# Patient Record
Sex: Male | Born: 1995 | Race: White | Hispanic: No | Marital: Single | Smoking: Current every day smoker
Health system: Southern US, Community
[De-identification: ages and names within clinical notes are randomized; demographics above are authoritative.]

---
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
## 2015-03-19 ENCOUNTER — Encounter (HOSPITAL_COMMUNITY): Payer: Self-pay | Admitting: Emergency Medicine

## 2015-03-19 ENCOUNTER — Emergency Department (HOSPITAL_COMMUNITY)
Admission: EM | Admit: 2015-03-19 | Discharge: 2015-03-19 | Disposition: A | Discharge status: Home / Self Care | Payer: Medicaid Other | Attending: Emergency Medicine | Admitting: Emergency Medicine

## 2015-03-19 DIAGNOSIS — R0981 Nasal congestion: Secondary | ICD-10-CM | POA: Diagnosis not present

## 2015-03-19 DIAGNOSIS — J018 Other acute sinusitis: Secondary | ICD-10-CM | POA: Diagnosis not present

## 2015-03-19 DIAGNOSIS — Z792 Long term (current) use of antibiotics: Secondary | ICD-10-CM | POA: Diagnosis not present

## 2015-03-19 DIAGNOSIS — R0989 Other specified symptoms and signs involving the circulatory and respiratory systems: Secondary | ICD-10-CM | POA: Diagnosis not present

## 2015-03-19 DIAGNOSIS — Z720 Tobacco use: Secondary | ICD-10-CM | POA: Insufficient documentation

## 2015-03-19 DIAGNOSIS — J029 Acute pharyngitis, unspecified: Secondary | ICD-10-CM

## 2015-03-19 DIAGNOSIS — M791 Myalgia: Secondary | ICD-10-CM | POA: Diagnosis not present

## 2015-03-19 LAB — RAPID STREP SCREEN (MED CTR MEBANE ONLY): Streptococcus, Group A Screen (Direct): NEGATIVE

## 2015-03-19 MED ORDER — IBUPROFEN 600 MG PO TABS: 600.0000 mg | ORAL_TABLET | Freq: Four times a day (QID) | ORAL | Status: DC | PRN

## 2015-03-19 MED ORDER — AMOXICILLIN 500 MG PO CAPS: 500.0000 mg | ORAL_CAPSULE | Freq: Three times a day (TID) | ORAL | Status: DC

## 2015-03-19 MED ORDER — PREDNISONE 10 MG PO TABS: ORAL_TABLET | ORAL | Status: DC

## 2015-03-19 MED ORDER — LORATADINE-PSEUDOEPHEDRINE ER 5-120 MG PO TB12: 1.0000 | ORAL_TABLET | Freq: Two times a day (BID) | ORAL | Status: DC

## 2015-03-19 NOTE — ED Notes (Signed)
Pt c/o of sore throat, cough, and generalized body aches for past 3 days. Pt denies fever/chills.

## 2015-03-19 NOTE — ED Provider Notes (Signed)
CSN: [id number]     Arrival date & time 03/19/15  1217 History   First MD Initiated Contact with Patient 03/19/15 1253     Chief Complaint  Patient presents with  . Sore Throat     (Consider location/radiation/quality/duration/timing/severity/associated sxs/prior Treatment) Patient is a 19 y.o. male presenting with pharyngitis. The history is provided by the patient.  Sore Throat This is a new problem. The current episode started in the past 7 days. The problem occurs intermittently. The problem has been gradually worsening. Associated symptoms include congestion, coughing, myalgias and a sore throat. The symptoms are aggravated by swallowing. He has tried nothing for the symptoms. The treatment provided no relief.    History reviewed. No pertinent past medical history. History reviewed. No pertinent past surgical history. No family history on file. Social History  Substance Use Topics  . Smoking status: Current Every Day Smoker -- 0.50 packs/day    Types: Cigarettes  . Smokeless tobacco: Never Used  . Alcohol Use: Yes     Comment: occ.    Review of Systems  HENT: Positive for congestion and sore throat.   Respiratory: Positive for cough.   Musculoskeletal: Positive for myalgias.  All other systems reviewed and are negative.     Allergies  Review of patient's allergies indicates no known allergies.  Home Medications   Prior to Admission medications   Medication Sig Start Date End Date Taking? Authorizing Provider  oxyCODONE-acetaminophen (PERCOCET) 7.5-325 MG per tablet Take 1 tablet by mouth every 6 (six) hours as needed for severe pain. 12/19/14   Joni Reining, PA-C  sulfamethoxazole-trimethoprim (BACTRIM DS,SEPTRA DS) 800-160 MG per tablet Take 1 tablet by mouth 2 (two) times daily. 12/19/14   Joni Reining, PA-C   BP 117/61 mmHg  Pulse 54  Temp(Src) 98.2 F (36.8 C) (Oral)  Resp 18  Ht 5\' 10"  (1.778 m)  Wt 135 lb (61.236 kg)  BMI 19.37 kg/m2  SpO2  100% Physical Exam  Constitutional: He is oriented to person, place, and time. He appears well-developed and well-nourished.  Non-toxic appearance.  HENT:  Head: Normocephalic.  Right Ear: Tympanic membrane and external ear normal.  Left Ear: Tympanic membrane and external ear normal.  Nasal congestion present.  There is moderate increased redness of the posterior pharynx. There is mild swelling of the uvula. The uvula is in the midline. The airway is patent.  Eyes: EOM and lids are normal. Pupils are equal, round, and reactive to light.  Neck: Normal range of motion. Neck supple. Carotid bruit is not present.  Cardiovascular: Normal rate, regular rhythm, normal heart sounds, intact distal pulses and normal pulses.   Pulmonary/Chest: Breath sounds normal. No respiratory distress.  Few scattered rhonchi present. Symmetrical rise and fall of the chest. Patient speaks in complete sentences.  Abdominal: Soft. Bowel sounds are normal. There is no tenderness. There is no guarding.  Musculoskeletal: Normal range of motion.  Lymphadenopathy:       Head (right side): No submandibular adenopathy present.       Head (left side): No submandibular adenopathy present.    He has no cervical adenopathy.  Neurological: He is alert and oriented to person, place, and time. He has normal strength. No cranial nerve deficit or sensory deficit. He exhibits normal muscle tone. Coordination normal.  Skin: Skin is warm and dry. No rash noted.  Psychiatric: He has a normal mood and affect. His speech is normal.  Nursing note and vitals reviewed.   ED Course  Procedures (including critical care time) Labs Review Labs Reviewed - No data to display  Imaging Review No results found. I have personally reviewed and evaluated these images and lab results as part of my medical decision-making.   EKG Interpretation None      MDM  Vital signs reviewed. The examination favors pharyngitis and sinusitis. Patient  is given a mask to use. He is asked to wash hands frequently. He is asked to increase fluids. Prescription for prednisone, ibuprofen, Claritin-D, and Amoxil given to the patient.    Final diagnoses:  None    *I have reviewed nursing notes, vital signs, and all appropriate lab and imaging results for this patient.Ivery Quale, PA-C 03/19/15 1348  Marily Memos, MD 03/20/15 [phone]

## 2015-03-19 NOTE — Discharge Instructions (Signed)
Please wash hands frequently. Please use a mask until symptoms have resolved. Please increase fluids. Use ibuprofen, prednisone, Amoxil daily. Use Claritin-D every 12 hours if needed for congestion and cough. Please see her Medicaid access physician for additional evaluation if not improving. Pharyngitis Pharyngitis is redness, pain, and swelling (inflammation) of your pharynx.  CAUSES  Pharyngitis is usually caused by infection. Most of the time, these infections are from viruses (viral) and are part of a cold. However, sometimes pharyngitis is caused by bacteria (bacterial). Pharyngitis can also be caused by allergies. Viral pharyngitis may be spread from person to person by coughing, sneezing, and personal items or utensils (cups, forks, spoons, toothbrushes). Bacterial pharyngitis may be spread from person to person by more intimate contact, such as kissing.  SIGNS AND SYMPTOMS  Symptoms of pharyngitis include:   Sore throat.   Tiredness (fatigue).   Low-grade fever.   Headache.  Joint pain and muscle aches.  Skin rashes.  Swollen lymph nodes.  Plaque-like film on throat or tonsils (often seen with bacterial pharyngitis). DIAGNOSIS  Your health care provider will ask you questions about your illness and your symptoms. Your medical history, along with a physical exam, is often all that is needed to diagnose pharyngitis. Sometimes, a rapid strep test is done. Other lab tests may also be done, depending on the suspected cause.  TREATMENT  Viral pharyngitis will usually get better in 3-4 days without the use of medicine. Bacterial pharyngitis is treated with medicines that kill germs (antibiotics).  HOME CARE INSTRUCTIONS   Drink enough water and fluids to keep your urine clear or pale yellow.   Only take over-the-counter or prescription medicines as directed by your health care provider:   If you are prescribed antibiotics, make sure you finish them even if you start to feel  better.   Do not take aspirin.   Get lots of rest.   Gargle with 8 oz of salt water ( tsp of salt per 1 qt of water) as often as every 1-2 hours to soothe your throat.   Throat lozenges (if you are not at risk for choking) or sprays may be used to soothe your throat. SEEK MEDICAL CARE IF:   You have large, tender lumps in your neck.  You have a rash.  You cough up green, yellow-brown, or bloody spit. SEEK IMMEDIATE MEDICAL CARE IF:   Your neck becomes stiff.  You drool or are unable to swallow liquids.  You vomit or are unable to keep medicines or liquids down.  You have severe pain that does not go away with the use of recommended medicines.  You have trouble breathing (not caused by a stuffy nose). MAKE SURE YOU:   Understand these instructions.  Will watch your condition.  Will get help right away if you are not doing well or get worse. Document Released: 07/03/2005 Document Revised: 04/23/2013 Document Reviewed: 03/10/2013 [facility] Patient Information 2015 [place], [place]. This information is not intended to replace advice given to you by your health care provider. Make sure you discuss any questions you have with your health care provider.

## 2015-03-22 LAB — CULTURE, GROUP A STREP: Strep A Culture: NEGATIVE

## 2015-04-29 ENCOUNTER — Emergency Department
Admission: EM | Admit: 2015-04-29 | Discharge: 2015-04-29 | Disposition: A | Discharge status: Home / Self Care | Payer: Medicaid Other | Attending: Emergency Medicine | Admitting: Emergency Medicine

## 2015-04-29 ENCOUNTER — Encounter: Payer: Self-pay | Admitting: Emergency Medicine

## 2015-04-29 ENCOUNTER — Emergency Department: Payer: Medicaid Other

## 2015-04-29 DIAGNOSIS — Z79899 Other long term (current) drug therapy: Secondary | ICD-10-CM | POA: Insufficient documentation

## 2015-04-29 DIAGNOSIS — Y998 Other external cause status: Secondary | ICD-10-CM | POA: Insufficient documentation

## 2015-04-29 DIAGNOSIS — S8991XA Unspecified injury of right lower leg, initial encounter: Secondary | ICD-10-CM | POA: Diagnosis not present

## 2015-04-29 DIAGNOSIS — Z792 Long term (current) use of antibiotics: Secondary | ICD-10-CM | POA: Diagnosis not present

## 2015-04-29 DIAGNOSIS — Y92410 Unspecified street and highway as the place of occurrence of the external cause: Secondary | ICD-10-CM | POA: Diagnosis not present

## 2015-04-29 DIAGNOSIS — Y9389 Activity, other specified: Secondary | ICD-10-CM | POA: Diagnosis not present

## 2015-04-29 DIAGNOSIS — Z720 Tobacco use: Secondary | ICD-10-CM | POA: Diagnosis not present

## 2015-04-29 DIAGNOSIS — S0990XA Unspecified injury of head, initial encounter: Secondary | ICD-10-CM | POA: Insufficient documentation

## 2015-04-29 MED ORDER — TRAMADOL HCL 50 MG PO TABS: 50.0000 mg | ORAL_TABLET | Freq: Four times a day (QID) | ORAL | Status: AC | PRN
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
## 2015-04-29 NOTE — Discharge Instructions (Signed)

## 2015-04-30 ENCOUNTER — Emergency Department (HOSPITAL_COMMUNITY): Payer: Medicaid Other

## 2015-04-30 ENCOUNTER — Emergency Department (HOSPITAL_COMMUNITY)
Admission: EM | Admit: 2015-04-30 | Discharge: 2015-04-30 | Disposition: A | Discharge status: Home / Self Care | Payer: Medicaid Other | Attending: Emergency Medicine | Admitting: Emergency Medicine

## 2015-04-30 ENCOUNTER — Encounter (HOSPITAL_COMMUNITY): Payer: Self-pay | Admitting: *Deleted

## 2015-04-30 DIAGNOSIS — Z79899 Other long term (current) drug therapy: Secondary | ICD-10-CM | POA: Insufficient documentation

## 2015-04-30 DIAGNOSIS — S161XXD Strain of muscle, fascia and tendon at neck level, subsequent encounter: Secondary | ICD-10-CM | POA: Insufficient documentation

## 2015-04-30 DIAGNOSIS — Z720 Tobacco use: Secondary | ICD-10-CM | POA: Insufficient documentation

## 2015-04-30 DIAGNOSIS — S199XXD Unspecified injury of neck, subsequent encounter: Secondary | ICD-10-CM | POA: Diagnosis present

## 2015-04-30 MED ORDER — HYDROCODONE-ACETAMINOPHEN 5-325 MG PO TABS: ORAL_TABLET | ORAL | Status: DC

## 2015-04-30 MED ORDER — CYCLOBENZAPRINE HCL 10 MG PO TABS: 10.0000 mg | ORAL_TABLET | Freq: Three times a day (TID) | ORAL | Status: DC | PRN

## 2015-04-30 MED ORDER — CYCLOBENZAPRINE HCL 10 MG PO TABS
10.0000 mg | ORAL_TABLET | Freq: Once | ORAL | Status: AC
  Administered 2015-04-30: 10 mg via ORAL
  Filled 2015-04-30: qty 1

## 2015-04-30 MED ORDER — HYDROCODONE-ACETAMINOPHEN 5-325 MG PO TABS
1.0000 | ORAL_TABLET | Freq: Once | ORAL | Status: AC
  Administered 2015-04-30: 1 via ORAL
  Filled 2015-04-30: qty 1

## 2015-04-30 NOTE — ED Notes (Signed)
Discharge instructions given, pt demonstrated teach back and verbal understanding. No concerns voiced.  
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
## 2016-08-09 ENCOUNTER — Encounter: Payer: Self-pay | Admitting: Emergency Medicine

## 2016-08-09 ENCOUNTER — Emergency Department
Admission: EM | Admit: 2016-08-09 | Discharge: 2016-08-09 | Disposition: A | Discharge status: Home / Self Care | Payer: No Typology Code available for payment source | Attending: Emergency Medicine | Admitting: Emergency Medicine

## 2016-08-09 ENCOUNTER — Emergency Department: Payer: No Typology Code available for payment source

## 2016-08-09 DIAGNOSIS — S3992XA Unspecified injury of lower back, initial encounter: Secondary | ICD-10-CM | POA: Diagnosis present

## 2016-08-09 DIAGNOSIS — S39012A Strain of muscle, fascia and tendon of lower back, initial encounter: Secondary | ICD-10-CM | POA: Diagnosis not present

## 2016-08-09 DIAGNOSIS — Y9389 Activity, other specified: Secondary | ICD-10-CM | POA: Diagnosis not present

## 2016-08-09 DIAGNOSIS — Y999 Unspecified external cause status: Secondary | ICD-10-CM | POA: Diagnosis not present

## 2016-08-09 DIAGNOSIS — Y92410 Unspecified street and highway as the place of occurrence of the external cause: Secondary | ICD-10-CM | POA: Diagnosis not present

## 2016-08-09 DIAGNOSIS — F17210 Nicotine dependence, cigarettes, uncomplicated: Secondary | ICD-10-CM | POA: Insufficient documentation

## 2016-08-09 DIAGNOSIS — M545 Low back pain, unspecified: Secondary | ICD-10-CM

## 2016-08-09 MED ORDER — NAPROXEN 500 MG PO TABS: 500.0000 mg | ORAL_TABLET | Freq: Two times a day (BID) | ORAL | 0 refills | Status: DC

## 2016-08-09 MED ORDER — TIZANIDINE HCL 4 MG PO TABS: 4.0000 mg | ORAL_TABLET | Freq: Three times a day (TID) | ORAL | 0 refills | Status: DC
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
## 2016-08-09 NOTE — ED Notes (Signed)
Pt able to ambulate without difficulty or distress. Pt verbalized understanding of discharge instructions, follow up and prescription.

## 2016-12-02 ENCOUNTER — Emergency Department: Payer: Medicaid Other

## 2016-12-02 ENCOUNTER — Emergency Department
Admission: EM | Admit: 2016-12-02 | Discharge: 2016-12-02 | Disposition: A | Discharge status: Home / Self Care | Payer: Medicaid Other | Attending: Emergency Medicine | Admitting: Emergency Medicine

## 2016-12-02 ENCOUNTER — Encounter: Payer: Self-pay | Admitting: Emergency Medicine

## 2016-12-02 DIAGNOSIS — R079 Chest pain, unspecified: Secondary | ICD-10-CM | POA: Insufficient documentation

## 2016-12-02 DIAGNOSIS — M25512 Pain in left shoulder: Secondary | ICD-10-CM | POA: Insufficient documentation

## 2016-12-02 DIAGNOSIS — F17210 Nicotine dependence, cigarettes, uncomplicated: Secondary | ICD-10-CM | POA: Insufficient documentation

## 2016-12-02 DIAGNOSIS — X509XXA Other and unspecified overexertion or strenuous movements or postures, initial encounter: Secondary | ICD-10-CM | POA: Insufficient documentation

## 2016-12-02 DIAGNOSIS — Y999 Unspecified external cause status: Secondary | ICD-10-CM | POA: Insufficient documentation

## 2016-12-02 DIAGNOSIS — Y9367 Activity, basketball: Secondary | ICD-10-CM | POA: Insufficient documentation

## 2016-12-02 DIAGNOSIS — Y929 Unspecified place or not applicable: Secondary | ICD-10-CM | POA: Insufficient documentation

## 2016-12-02 LAB — BASIC METABOLIC PANEL
Anion gap: 7 (ref 5–15)
BUN: 15 mg/dL (ref 6–20)
CALCIUM: 8.8 mg/dL — AB (ref 8.9–10.3)
CO2: 28 mmol/L (ref 22–32)
Chloride: 102 mmol/L (ref 101–111)
Creatinine, Ser: 0.98 mg/dL (ref 0.61–1.24)
GFR calc non Af Amer: >60 mL/min (ref >60)
Glucose, Bld: 96 mg/dL (ref 65–99)
Potassium: 3.6 mmol/L (ref 3.5–5.1)
SODIUM: 137 mmol/L (ref 135–145)

## 2016-12-02 LAB — CBC
HCT: 42.9 % (ref 40.0–52.0)
Hemoglobin: 14.9 g/dL (ref 13.0–18.0)
MCH: 29.2 pg (ref 26.0–34.0)
MCHC: 34.7 g/dL (ref 32.0–36.0)
MCV: 84.3 fL (ref 80.0–100.0)
Platelets: 234 10*3/uL (ref 150–440)
RBC: 5.09 MIL/uL (ref 4.40–5.90)
RDW: 14.3 % (ref 11.5–14.5)
WBC: 6.3 10*3/uL (ref 3.8–10.6)

## 2016-12-02 LAB — TROPONIN I: Troponin I: <0.03 ng/mL (ref <0.03)

## 2016-12-02 MED ORDER — IBUPROFEN 600 MG PO TABS: 600.0000 mg | ORAL_TABLET | Freq: Four times a day (QID) | ORAL | 0 refills | Status: DC | PRN

## 2016-12-02 MED ORDER — KETOROLAC TROMETHAMINE 30 MG/ML IJ SOLN
30.0000 mg | Freq: Once | INTRAMUSCULAR | Status: AC
  Administered 2016-12-02: 30 mg via INTRAMUSCULAR
  Filled 2016-12-02: qty 1

## 2016-12-02 MED ORDER — ACETAMINOPHEN 500 MG PO TABS
1000.0000 mg | ORAL_TABLET | Freq: Once | ORAL | Status: AC
  Administered 2016-12-02: 1000 mg via ORAL
  Filled 2016-12-02: qty 2

## 2016-12-02 NOTE — ED Notes (Signed)
Patient to ER with c/o pain to left chest/ribs/axillary region. Pain can be reproduced/worsens with movement. Patient reports pain with palpation.

## 2016-12-02 NOTE — ED Triage Notes (Signed)
Pt c/o chest pain that started this morning when he woke up and has been constant. Pt reports feels like he is going to die. denies drug use.  Pt grasping chest and sitting in floor while checking in.  Reports had same pain yesterday and passed out because it was so bad.  Pain worse on palpation. VSS

## 2016-12-02 NOTE — ED Provider Notes (Signed)
[facility] Emergency Department Provider Note  ____________________________________________  Time seen: Approximately 11:37 AM  I have reviewed the triage vital signs and the nursing notes.   HISTORY  Chief Complaint Chest Pain   HPI Timothy Butler is a 21 y.o. male with h/o smoking who presents for evaluation of Left shoulder pain. Patient reports 2 days of dull constant L shoulder pain radiating to the left scapula and axilla, currently 10 out of 10, worse with movement of the L arm. Has not tried anything at home for the pain. Pain started 1 day after patient was playing basketball. He also works with the [employer] where patient performs a lot of physical work with his arms. He is left handed. He denies trauma to his shoulder. He denies CP, SOB, nausea, vomiting, numbness or weakness of his arm, fever or chills. No personal or family history of blood clots or ischemic heart disease. Patient tells me yesterday that he was walking around with his friends when the pain got very severe. He reports that he felt nauseous and passed out due to the intensity of the pain. He did not sustain any injuries from the fall. He denies any drug use.  History reviewed. No pertinent past medical history.  There are no active problems to display for this patient.   History reviewed. No pertinent surgical history.  Prior to Admission medications   Medication Sig Start Date End Date Taking? Authorizing Provider  ibuprofen (ADVIL,MOTRIN) 600 MG tablet Take 1 tablet (600 mg total) by mouth every 6 (six) hours as needed. 12/02/16   Nita SickleVeronese, [place], MD  naproxen (NAPROSYN) 500 MG tablet Take 1 tablet (500 mg total) by mouth 2 (two) times daily with a meal. Patient not taking: Reported on 12/02/2016 08/09/16   Kem Boroughsriplett, Cari B, FNP  tiZANidine (ZANAFLEX) 4 MG tablet Take 1 tablet (4 mg total) by mouth 3 (three) times daily. Patient not taking: Reported on 12/02/2016 08/09/16    Kem Boroughsriplett, Cari B, FNP    Allergies Tramadol  History reviewed. No pertinent family history.  Social History Social History  Substance Use Topics  . Smoking status: Current Every Day Smoker    Packs/day: 0.50    Types: Cigarettes  . Smokeless tobacco: Never Used  . Alcohol use No    Review of Systems  Constitutional: Negative for fever. Eyes: Negative for visual changes. ENT: Negative for sore throat. Neck: No neck pain  Cardiovascular: Negative for chest pain. Respiratory: Negative for shortness of breath. Gastrointestinal: Negative for abdominal pain, vomiting or diarrhea. Genitourinary: Negative for dysuria. Musculoskeletal: Negative for back pain. + L shoulder pain Skin: Negative for rash. Neurological: Negative for headaches, weakness or numbness. Psych: No SI or HI  ____________________________________________   PHYSICAL EXAM:  VITAL SIGNS: ED Triage Vitals  Enc Vitals Group     BP 12/02/16 0933 127/80     Pulse Rate 12/02/16 0933 (!) 53     Resp 12/02/16 0933 20     Temp 12/02/16 0933 98.2 F (36.8 C)     Temp Source 12/02/16 0933 Oral     SpO2 12/02/16 0933 100 %     Weight 12/02/16 0929 140 lb (63.5 kg)     Height 12/02/16 0929 5\' 9"  (1.753 m)     Head Circumference --      Peak Flow --      Pain Score 12/02/16 0929 10     Pain Loc --      Pain Edu? --  Excl. in GC? --     Constitutional: Alert and oriented. Well appearing and in no apparent distress. HEENT:      Head: Normocephalic and atraumatic.         Eyes: Conjunctivae are normal. Sclera is non-icteric.       Mouth/Throat: Mucous membranes are moist.       Neck: Supple with no signs of meningismus. Cardiovascular: Regular rate and rhythm. No murmurs, gallops, or rubs. 2+ symmetrical distal pulses are present in all extremities. No JVD. Respiratory: Normal respiratory effort. Lungs are clear to auscultation bilaterally. No wheezes, crackles, or rhonchi.  Gastrointestinal: Soft, non  tender, and non distended with positive bowel sounds. No rebound or guarding. Musculoskeletal: Full range of motion of the left shoulder with pain over the deltoid muscle on palpation, no swelling or erythema, no bruising or evidence of trauma, no obvious deformities  Neurologic: Normal speech and language. Face is symmetric. Moving all extremities. No gross focal neurologic deficits are appreciated. Skin: Skin is warm, dry and intact. No rash noted. Psychiatric: Mood and affect are normal. Speech and behavior are normal.  ____________________________________________   LABS (all labs ordered are listed, but only abnormal results are displayed)  Labs Reviewed  BASIC METABOLIC PANEL - Abnormal; Notable for the following:       Result Value   Calcium 8.8 (*)    All other components within normal limits  CBC  TROPONIN I   ____________________________________________  EKG  ED ECG REPORT I, Nita Sickle, the attending physician, personally viewed and interpreted this ECG.  Normal sinus rhythm, rate of 67, normal intervals, RAD, no ST elevations or depressions, flattening T waves in aVL. No prior for comparison ____________________________________________  RADIOLOGY  CXR: Negative  XR L shoulder: negative  ____________________________________________   PROCEDURES  Procedure(s) performed: None Procedures Critical Care performed:  None ____________________________________________   INITIAL IMPRESSION / ASSESSMENT AND PLAN / ED COURSE   21 y.o. male with h/o smoking who presents for evaluation of Left shoulder pain x 2 days worse with movement of the L arm and with palpation over the L deltoid region. No evidence of septic joint with no swelling, erythema, fever, and intact ROM. Pain MSK in nature and reproducible on exam. Patient underwent EKG and troponin with no ischemic changes. PERC negative. XR of shoulder pain. Will give toradol and tylenol     _________________________ 12:17 PM on 12/02/2016 -----------------------------------------  Pain is markedly improved with Tylenol and Toradol. Her x-ray with no acute findings. Patient requesting discharge. Was provided with 600 mg ibuprofen prescription and range of motion exercises to his shoulder.  Pertinent labs & imaging results that were available during my care of the patient were reviewed by me and considered in my medical decision making (see chart for details).    ____________________________________________   FINAL CLINICAL IMPRESSION(S) / ED DIAGNOSES  Final diagnoses:  Acute pain of left shoulder      NEW MEDICATIONS STARTED DURING THIS VISIT:  New Prescriptions   IBUPROFEN (ADVIL,MOTRIN) 600 MG TABLET    Take 1 tablet (600 mg total) by mouth every 6 (six) hours as needed.     Note:  This document was prepared using Dragon voice recognition software and may include unintentional dictation errors.    Nita Sickle, MD 12/02/16 [phone]

## 2016-12-13 ENCOUNTER — Emergency Department (HOSPITAL_COMMUNITY)
Admission: EM | Admit: 2016-12-13 | Discharge: 2016-12-13 | Disposition: A | Discharge status: Home / Self Care | Payer: Self-pay | Attending: Emergency Medicine | Admitting: Emergency Medicine

## 2016-12-13 ENCOUNTER — Encounter (HOSPITAL_COMMUNITY): Payer: Self-pay

## 2016-12-13 ENCOUNTER — Emergency Department (HOSPITAL_COMMUNITY): Payer: Self-pay

## 2016-12-13 DIAGNOSIS — F1290 Cannabis use, unspecified, uncomplicated: Secondary | ICD-10-CM | POA: Insufficient documentation

## 2016-12-13 DIAGNOSIS — B349 Viral infection, unspecified: Secondary | ICD-10-CM | POA: Insufficient documentation

## 2016-12-13 DIAGNOSIS — R55 Syncope and collapse: Secondary | ICD-10-CM | POA: Insufficient documentation

## 2016-12-13 DIAGNOSIS — Z79899 Other long term (current) drug therapy: Secondary | ICD-10-CM | POA: Insufficient documentation

## 2016-12-13 DIAGNOSIS — F17210 Nicotine dependence, cigarettes, uncomplicated: Secondary | ICD-10-CM | POA: Insufficient documentation

## 2016-12-13 LAB — COMPREHENSIVE METABOLIC PANEL
ALT: 21 U/L (ref 17–63)
AST: 22 U/L (ref 15–41)
Albumin: 3.7 g/dL (ref 3.5–5.0)
Alkaline Phosphatase: 56 U/L (ref 38–126)
Anion gap: 6 (ref 5–15)
BILIRUBIN TOTAL: 0.6 mg/dL (ref 0.3–1.2)
BUN: 10 mg/dL (ref 6–20)
CO2: 28 mmol/L (ref 22–32)
Calcium: 9 mg/dL (ref 8.9–10.3)
Chloride: 105 mmol/L (ref 101–111)
Creatinine, Ser: 0.87 mg/dL (ref 0.61–1.24)
GFR calc Af Amer: >60 mL/min (ref >60)
Glucose, Bld: 98 mg/dL (ref 65–99)
POTASSIUM: 4.5 mmol/L (ref 3.5–5.1)
Sodium: 139 mmol/L (ref 135–145)
TOTAL PROTEIN: 6.8 g/dL (ref 6.5–8.1)

## 2016-12-13 LAB — URINALYSIS, ROUTINE W REFLEX MICROSCOPIC
Bilirubin Urine: NEGATIVE
GLUCOSE, UA: NEGATIVE mg/dL
HGB URINE DIPSTICK: NEGATIVE
Ketones, ur: NEGATIVE mg/dL
Leukocytes, UA: NEGATIVE
Nitrite: NEGATIVE
PROTEIN: NEGATIVE mg/dL
SPECIFIC GRAVITY, URINE: 1.016 (ref 1.005–1.030)
pH: 8 (ref 5.0–8.0)

## 2016-12-13 LAB — CBC WITH DIFFERENTIAL/PLATELET
BASOS ABS: 0 10*3/uL (ref 0.0–0.1)
BASOS PCT: 0 %
Eosinophils Absolute: 0.2 10*3/uL (ref 0.0–0.7)
Eosinophils Relative: 4 %
HEMATOCRIT: 39.5 % (ref 39.0–52.0)
HEMOGLOBIN: 13.2 g/dL (ref 13.0–17.0)
LYMPHS PCT: 26 %
Lymphs Abs: 1.2 10*3/uL (ref 0.7–4.0)
MCH: 28.7 pg (ref 26.0–34.0)
MCHC: 33.4 g/dL (ref 30.0–36.0)
MCV: 85.9 fL (ref 78.0–100.0)
Monocytes Absolute: 0.5 10*3/uL (ref 0.1–1.0)
Monocytes Relative: 12 %
NEUTROS ABS: 2.6 10*3/uL (ref 1.7–7.7)
NEUTROS PCT: 58 %
Platelets: 278 10*3/uL (ref 150–400)
RBC: 4.6 MIL/uL (ref 4.22–5.81)
RDW: 13.2 % (ref 11.5–15.5)
WBC: 4.5 10*3/uL (ref 4.0–10.5)

## 2016-12-13 NOTE — ED Triage Notes (Signed)
Pt reports has been having night sweats for the past year and says its getting worse.  Reports Friday night he felt nauseated and almost passed out.

## 2016-12-13 NOTE — ED Triage Notes (Signed)
Pt also reports intermittent chest pain and sob that is worse with exertion.

## 2016-12-13 NOTE — Discharge Instructions (Signed)
Follow up at the [facility] next week if needed
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
## 2017-01-28 ENCOUNTER — Encounter (HOSPITAL_COMMUNITY): Payer: Self-pay | Admitting: *Deleted

## 2017-01-28 ENCOUNTER — Emergency Department (HOSPITAL_COMMUNITY): Payer: No Typology Code available for payment source

## 2017-01-28 ENCOUNTER — Emergency Department (HOSPITAL_COMMUNITY)
Admission: EM | Admit: 2017-01-28 | Discharge: 2017-01-29 | Disposition: A | Discharge status: Home / Self Care | Payer: No Typology Code available for payment source | Attending: Emergency Medicine | Admitting: Emergency Medicine

## 2017-01-28 DIAGNOSIS — T148XXA Other injury of unspecified body region, initial encounter: Secondary | ICD-10-CM | POA: Diagnosis not present

## 2017-01-28 DIAGNOSIS — F17210 Nicotine dependence, cigarettes, uncomplicated: Secondary | ICD-10-CM | POA: Insufficient documentation

## 2017-01-28 DIAGNOSIS — Y9389 Activity, other specified: Secondary | ICD-10-CM | POA: Insufficient documentation

## 2017-01-28 DIAGNOSIS — Z79899 Other long term (current) drug therapy: Secondary | ICD-10-CM | POA: Diagnosis not present

## 2017-01-28 DIAGNOSIS — T1490XA Injury, unspecified, initial encounter: Secondary | ICD-10-CM | POA: Diagnosis present

## 2017-01-28 DIAGNOSIS — Y92410 Unspecified street and highway as the place of occurrence of the external cause: Secondary | ICD-10-CM | POA: Diagnosis not present

## 2017-01-28 DIAGNOSIS — Y999 Unspecified external cause status: Secondary | ICD-10-CM | POA: Insufficient documentation

## 2017-01-28 DIAGNOSIS — M7918 Myalgia, other site: Secondary | ICD-10-CM

## 2017-01-28 MED ORDER — CYCLOBENZAPRINE HCL 10 MG PO TABS: 10.0000 mg | ORAL_TABLET | Freq: Three times a day (TID) | ORAL | 0 refills | Status: AC

## 2017-01-28 MED ORDER — CYCLOBENZAPRINE HCL 10 MG PO TABS
10.0000 mg | ORAL_TABLET | Freq: Once | ORAL | Status: AC
Start: 2017-01-28 — End: 2017-01-29
  Administered 2017-01-29: 10 mg via ORAL
  Filled 2017-01-28: qty 1

## 2017-01-28 MED ORDER — IBUPROFEN 600 MG PO TABS
600.0000 mg | ORAL_TABLET | Freq: Four times a day (QID) | ORAL | 0 refills | Status: AC
Start: 2017-01-28 — End: ?

## 2017-01-28 MED ORDER — IBUPROFEN 800 MG PO TABS
800.0000 mg | ORAL_TABLET | Freq: Once | ORAL | Status: AC
  Administered 2017-01-29: 800 mg via ORAL
  Filled 2017-01-28: qty 1
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
## 2017-01-29 NOTE — Discharge Instructions (Signed)
Your vital signs within normal limits. Your oxygen level is 98% on room air. Within normal limits by my interpretation. Your x-rays are negative for acute findings. Please use your thumb splint over the next 5-7 days. Use Flexeril 3 times daily for spasm pain, use ibuprofen with breakfast, lunch, dinner, and at bedtime. [facility] if any follow-up or rechecked needed.  [facility]  [address] [place], [place] [address] [phone]  Services [facility] offers a variety of basic health services.  Services include but are not limited to: Blood pressure checks  Heart rate checks  Blood sugar checks  Urine analysis  Rapid strep tests  Pregnancy tests.  Health education and referrals  People needing more complex services will be directed to a physician online. Using these virtual visits, doctors can evaluate and prescribe medicine and treatments. There will be no medication on-site, though [facility] will help patients fill their prescriptions at little to no cost.   For More information please go to: [email or web address]

## 2018-07-25 IMAGING — CR DG CHEST 2V
1 series · 2 of 2 positions shown · non-contrast
Comparison: None.

CLINICAL DATA: Left anterior chest pain and left shoulder since
yesterday.

EXAM:
CHEST  2 VIEW

[Series 1: dg chest 2 view · 0.14mm/px · 2 of 2 slices shown]
[im 1/2]
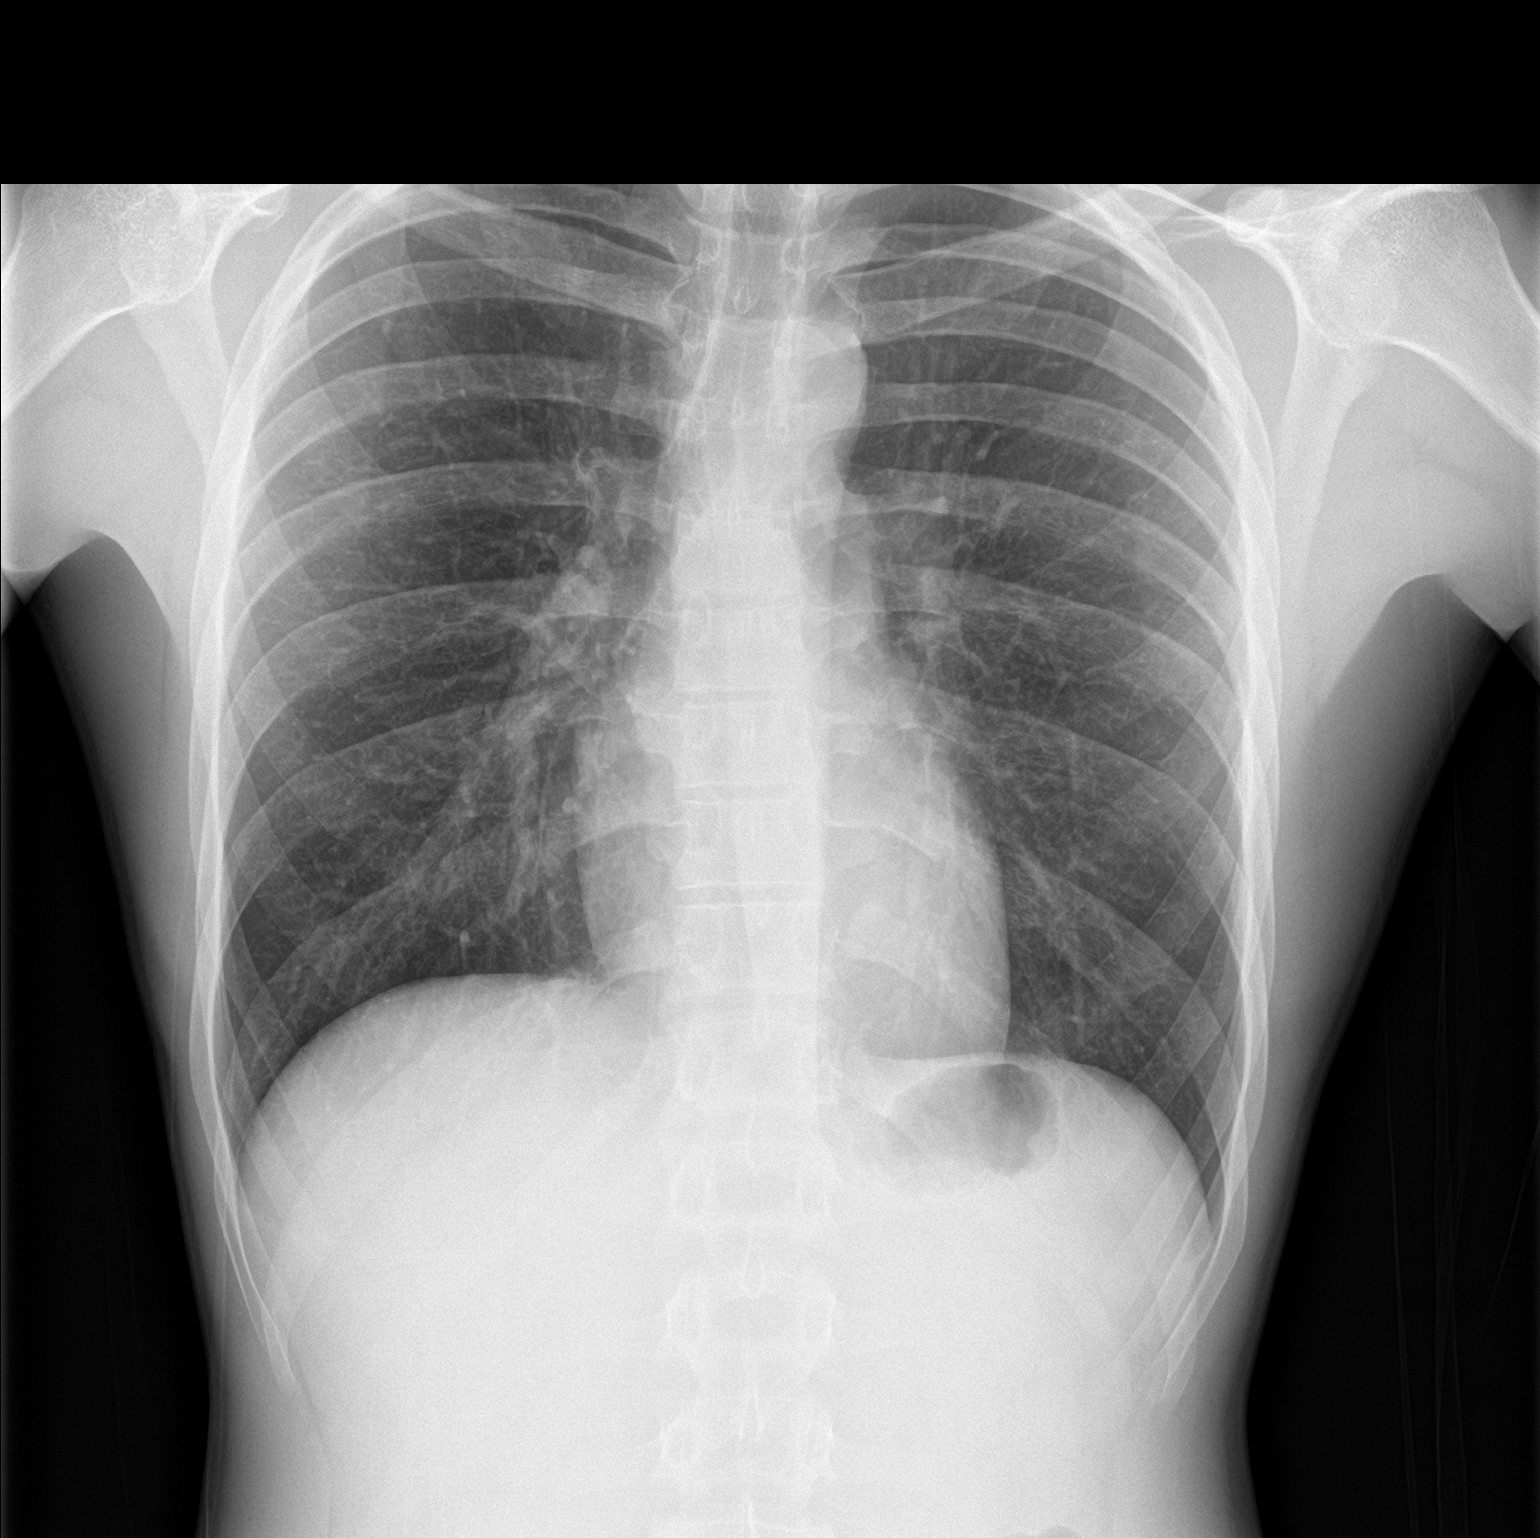
[im 2/2]
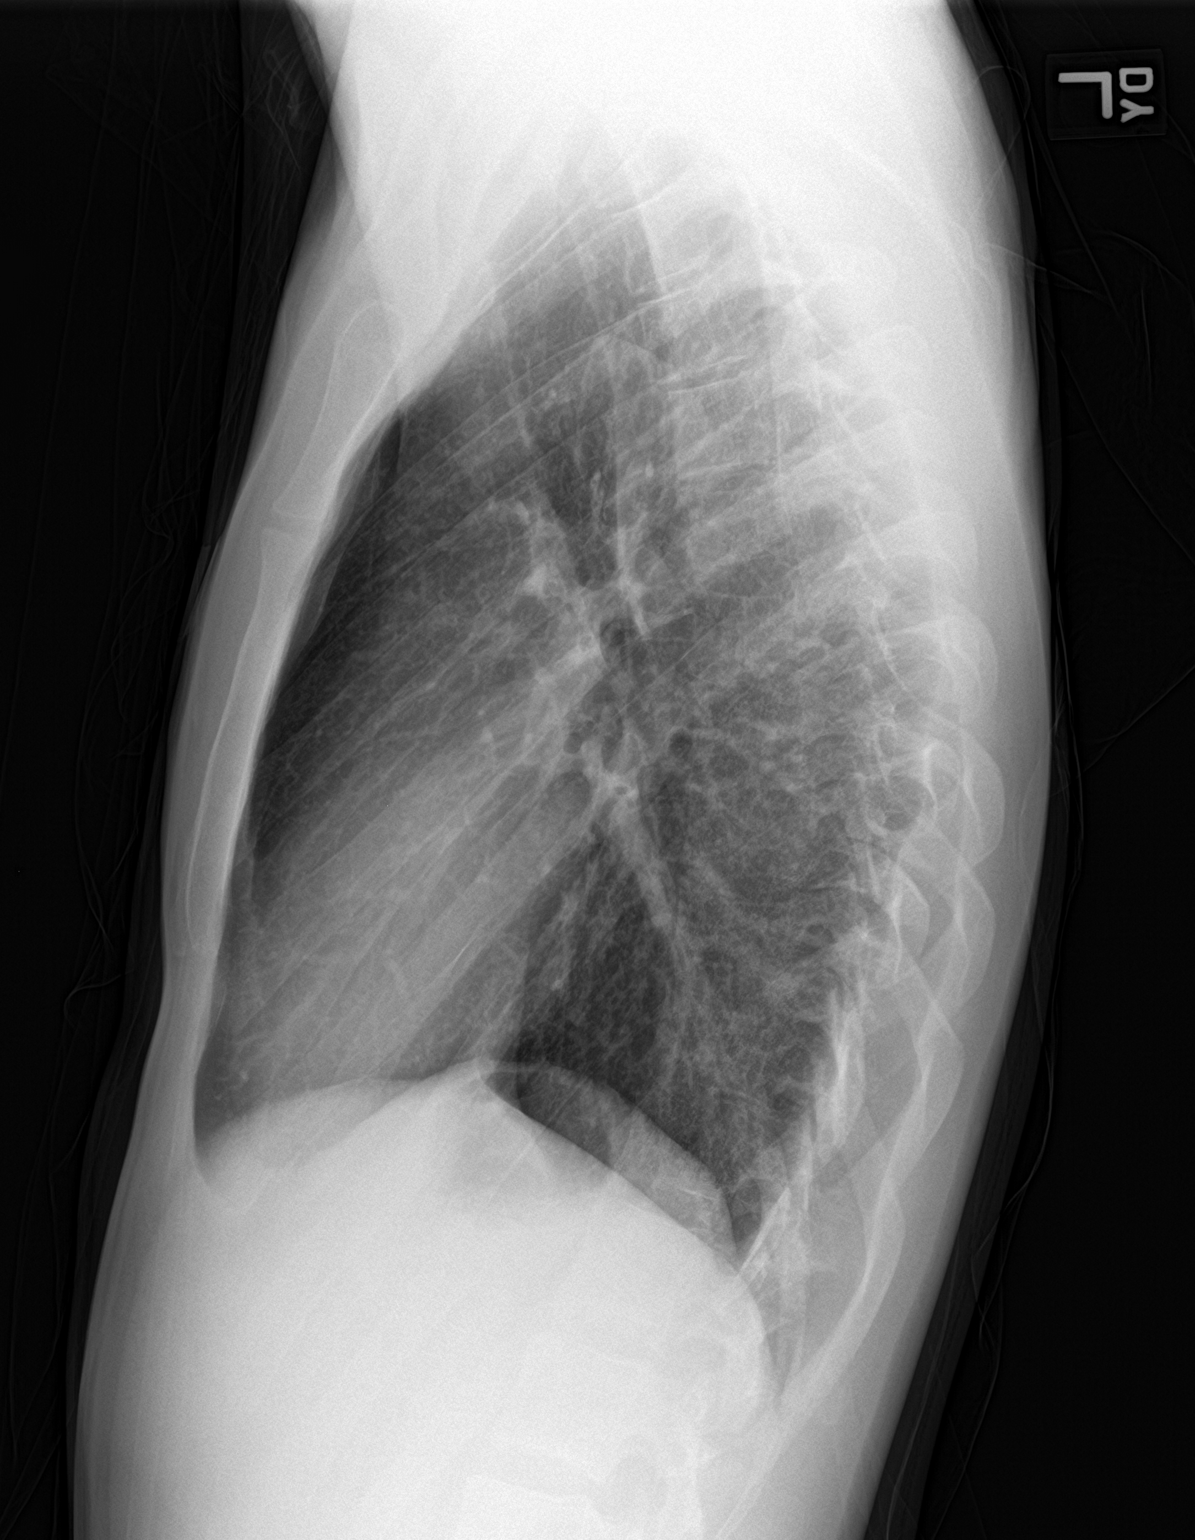

[2 of 2 positions shown; findings below may reference images not displayed]

FINDINGS: Trachea is midline. Heart size normal. Lungs are somewhat
hyperinflated but clear. No pleural fluid.
IMPRESSION: Mild hyperinflation without acute finding.
